# Patient Record
Sex: Male | Born: 1975
Health system: Southern US, Community
[De-identification: ages and names within clinical notes are randomized; demographics above are authoritative.]

---

## 2013-06-28 ENCOUNTER — Emergency Department (HOSPITAL_BASED_OUTPATIENT_CLINIC_OR_DEPARTMENT_OTHER)
Admission: EM | Admit: 2013-06-28 | Discharge: 2013-06-28 | Disposition: A | Discharge status: Home / Self Care | Payer: No Typology Code available for payment source | Attending: Emergency Medicine | Admitting: Emergency Medicine

## 2013-06-28 ENCOUNTER — Emergency Department (HOSPITAL_BASED_OUTPATIENT_CLINIC_OR_DEPARTMENT_OTHER): Payer: No Typology Code available for payment source

## 2013-06-28 ENCOUNTER — Encounter (HOSPITAL_BASED_OUTPATIENT_CLINIC_OR_DEPARTMENT_OTHER): Payer: Self-pay | Admitting: Emergency Medicine

## 2013-06-28 DIAGNOSIS — S139XXA Sprain of joints and ligaments of unspecified parts of neck, initial encounter: Secondary | ICD-10-CM | POA: Insufficient documentation

## 2013-06-28 DIAGNOSIS — Y9389 Activity, other specified: Secondary | ICD-10-CM | POA: Insufficient documentation

## 2013-06-28 DIAGNOSIS — Y92410 Unspecified street and highway as the place of occurrence of the external cause: Secondary | ICD-10-CM | POA: Insufficient documentation

## 2013-06-28 DIAGNOSIS — S161XXA Strain of muscle, fascia and tendon at neck level, initial encounter: Secondary | ICD-10-CM

## 2013-06-28 DIAGNOSIS — S0990XA Unspecified injury of head, initial encounter: Secondary | ICD-10-CM | POA: Insufficient documentation

## 2013-06-28 MED ORDER — CYCLOBENZAPRINE HCL 5 MG PO TABS: 5.0000 mg | ORAL_TABLET | Freq: Three times a day (TID) | ORAL | Status: DC | PRN

## 2013-06-28 MED ORDER — CYCLOBENZAPRINE HCL 10 MG PO TABS
5.0000 mg | ORAL_TABLET | Freq: Once | ORAL | Status: AC
  Administered 2013-06-28: 5 mg via ORAL
  Filled 2013-06-28: qty 1

## 2013-06-28 MED ORDER — IBUPROFEN 600 MG PO TABS: 600.0000 mg | ORAL_TABLET | Freq: Four times a day (QID) | ORAL | Status: DC | PRN

## 2013-06-28 MED ORDER — IBUPROFEN 400 MG PO TABS
600.0000 mg | ORAL_TABLET | Freq: Once | ORAL | Status: AC
  Administered 2013-06-28: 600 mg via ORAL
  Filled 2013-06-28 (×2): qty 1
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
## 2013-06-28 NOTE — ED Notes (Signed)
Patient transported to CT 

## 2013-06-28 NOTE — Discharge Instructions (Signed)
Cervical Sprain °A cervical sprain is an injury in the neck in which the strong, fibrous tissues (ligaments) that connect your neck bones stretch or tear. Cervical sprains can range from mild to severe. Severe cervical sprains can cause the neck vertebrae to be unstable. This can lead to damage of the spinal cord and can result in serious nervous system problems. The amount of time it takes for a cervical sprain to get better depends on the cause and extent of the injury. Most cervical sprains heal in 1 to 3 weeks. °CAUSES  °Severe cervical sprains may be caused by:  °· Contact sport injuries (such as from football, rugby, wrestling, hockey, auto racing, gymnastics, diving, martial arts, or boxing).   °· Motor vehicle collisions.   °· Whiplash injuries. This is an injury from a sudden forward-and backward whipping movement of the head and neck.  °· Falls.   °Mild cervical sprains may be caused by:  °· Being in an awkward position, such as while cradling a telephone between your ear and shoulder.   °· Sitting in a chair that does not offer proper support.   °· Working at a poorly designed computer station.   °· Looking up or down for long periods of time.   °SYMPTOMS  °· Pain, soreness, stiffness, or a burning sensation in the front, back, or sides of the neck. This discomfort may develop immediately after the injury or slowly, 24 hours or more after the injury.   °· Pain or tenderness directly in the middle of the back of the neck.   °· Shoulder or upper back pain.   °· Limited ability to move the neck.   °· Headache.   °· Dizziness.   °· Weakness, numbness, or tingling in the hands or arms.   °· Muscle spasms.   °· Difficulty swallowing or chewing.   °· Tenderness and swelling of the neck.   °DIAGNOSIS  °Most of the time your health care provider can diagnose a cervical sprain by taking your history and doing a physical exam. Your health care provider will ask about previous neck injuries and any known neck  problems, such as arthritis in the neck. X-rays may be taken to find out if there are any other problems, such as with the bones of the neck. Other tests, such as a CT scan or MRI, may also be needed.  °TREATMENT  °Treatment depends on the severity of the cervical sprain. Mild sprains can be treated with rest, keeping the neck in place (immobilization), and pain medicines. Severe cervical sprains are immediately immobilized. Further treatment is done to help with pain, muscle spasms, and other symptoms and may include: °· Medicines, such as pain relievers, numbing medicines, or muscle relaxants.   °· Physical therapy. This may involve stretching exercises, strengthening exercises, and posture training. Exercises and improved posture can help stabilize the neck, strengthen muscles, and help stop symptoms from returning.   °HOME CARE INSTRUCTIONS  °· Put ice on the injured area.   °· Put ice in a plastic bag.   °· Place a towel between your skin and the bag.   °· Leave the ice on for 15 20 minutes, 3 4 times a day.   °· If your injury was severe, you may have been given a cervical collar to wear. A cervical collar is a two-piece collar designed to keep your neck from moving while it heals. °· Do not remove the collar unless instructed by your health care provider. °· If you have long hair, keep it outside of the collar. °· Ask your health care provider before making any adjustments to your collar.   Minor adjustments may be required over time to improve comfort and reduce pressure on your chin or on the back of your head. °· If you are allowed to remove the collar for cleaning or bathing, follow your health care provider's instructions on how to do so safely. °· Keep your collar clean by wiping it with mild soap and water and drying it completely. If the collar you have been given includes removable pads, remove them every 1 2 days and hand wash them with soap and water. Allow them to air dry. They should be completely  dry before you wear them in the collar. °· If you are allowed to remove the collar for cleaning and bathing, wash and dry the skin of your neck. Check your skin for irritation or sores. If you see any, tell your health care provider. °· Do not drive while wearing the collar.   °· Only take over-the-counter or prescription medicines for pain, discomfort, or fever as directed by your health care provider.   °· Keep all follow-up appointments as directed by your health care provider.   °· Keep all physical therapy appointments as directed by your health care provider.   °· Make any needed adjustments to your workstation to promote good posture.   °· Avoid positions and activities that make your symptoms worse.   °· Warm up and stretch before being active to help prevent problems.   °SEEK MEDICAL CARE IF:  °· Your pain is not controlled with medicine.   °· You are unable to decrease your pain medicine over time as planned.   °· Your activity level is not improving as expected.   °SEEK IMMEDIATE MEDICAL CARE IF:  °· You develop any bleeding. °· You develop stomach upset. °· You have signs of an allergic reaction to your medicine.   °· Your symptoms get worse.   °· You develop new, unexplained symptoms.   °· You have numbness, tingling, weakness, or paralysis in any part of your body.   °MAKE SURE YOU:  °· Understand these instructions. °· Will watch your condition. °· Will get help right away if you are not doing well or get worse. °Document Released: 11/08/2006 Document Revised: 11/01/2012 Document Reviewed: 07/19/2012 °ExitCare® Patient Information ©2014 [employer]. ° °Motor Vehicle Collision  °It is common to have multiple bruises and sore muscles after a motor vehicle collision (MVC). These tend to feel worse for the first 24 hours. You may have the most stiffness and soreness over the first several hours. You may also feel worse when you wake up the first morning after your collision. After this point, you will  usually begin to improve with each day. The speed of improvement often depends on the severity of the collision, the number of injuries, and the location and nature of these injuries. °HOME CARE INSTRUCTIONS  °· Put ice on the injured area. °· Put ice in a plastic bag. °· Place a towel between your skin and the bag. °· Leave the ice on for 15-20 minutes, 03-04 times a day. °· Drink enough fluids to keep your urine clear or pale yellow. Do not drink alcohol. °· Take a warm shower or bath once or twice a day. This will increase blood flow to sore muscles. °· You may return to activities as directed by your caregiver. Be careful when lifting, as this may aggravate neck or back pain. °· Only take over-the-counter or prescription medicines for pain, discomfort, or fever as directed by your caregiver. Do not use aspirin. This may increase bruising and bleeding. °SEEK IMMEDIATE MEDICAL CARE IF: °·   You have numbness, tingling, or weakness in the arms or legs. °· You develop severe headaches not relieved with medicine. °· You have severe neck pain, especially tenderness in the middle of the back of your neck. °· You have changes in bowel or bladder control. °· There is increasing pain in any area of the body. °· You have shortness of breath, lightheadedness, dizziness, or fainting. °· You have chest pain. °· You feel sick to your stomach (nauseous), throw up (vomit), or sweat. °· You have increasing abdominal discomfort. °· There is blood in your urine, stool, or vomit. °· You have pain in your shoulder (shoulder strap areas). °· You feel your symptoms are getting worse. °MAKE SURE YOU:  °· Understand these instructions. °· Will watch your condition. °· Will get help right away if you are not doing well or get worse. °Document Released: 01/11/2005 Document Revised: 04/05/2011 Document Reviewed: 06/10/2010 °[employer]Care® Patient Information ©2014 [employer]. ° °

## 2013-07-05 ENCOUNTER — Encounter: Payer: Self-pay | Admitting: Sports Medicine

## 2013-07-05 ENCOUNTER — Ambulatory Visit (INDEPENDENT_AMBULATORY_CARE_PROVIDER_SITE_OTHER): Payer: 59 | Admitting: Sports Medicine

## 2013-07-05 VITALS — BP 155/83 | HR 96 | Ht 70.0 in | Wt 191.0 lb

## 2013-07-05 DIAGNOSIS — M51379 Other intervertebral disc degeneration, lumbosacral region without mention of lumbar back pain or lower extremity pain: Secondary | ICD-10-CM

## 2013-07-05 DIAGNOSIS — M5136 Other intervertebral disc degeneration, lumbar region: Secondary | ICD-10-CM

## 2013-07-05 DIAGNOSIS — M5137 Other intervertebral disc degeneration, lumbosacral region: Secondary | ICD-10-CM

## 2013-07-05 DIAGNOSIS — M51369 Other intervertebral disc degeneration, lumbar region without mention of lumbar back pain or lower extremity pain: Secondary | ICD-10-CM | POA: Insufficient documentation

## 2013-07-05 MED ORDER — PREDNISONE 50 MG PO TABS: ORAL_TABLET | ORAL | Status: DC

## 2013-07-05 MED ORDER — KETOROLAC TROMETHAMINE 30 MG/ML IJ SOLN
30.0000 mg | Freq: Once | INTRAMUSCULAR | Status: AC
  Administered 2013-07-05: 30 mg via INTRAMUSCULAR

## 2013-07-05 MED ORDER — MELOXICAM 15 MG PO TABS: ORAL_TABLET | ORAL | Status: DC

## 2013-07-05 MED ORDER — METHYLPREDNISOLONE ACETATE 80 MG/ML IJ SUSP
80.0000 mg | Freq: Once | INTRAMUSCULAR | Status: AC
  Administered 2013-07-05: 80 mg via INTRAMUSCULAR

## 2013-07-05 NOTE — Assessment & Plan Note (Signed)
Is a large L5-S1 disc protrusion likely affecting the right side of L5 and S1 nerve root. 80 mg Depo-Medrol, 30mg  of Toradol intramuscular. Prednisone, Mobic, continue Flexeril given by the emergency department. Home rehabilitation. Return to see me in a month. He is working her short-term disability, I told him I am happy to sign off for a month
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
## 2013-08-02 ENCOUNTER — Encounter: Payer: Self-pay | Admitting: Sports Medicine

## 2013-08-02 ENCOUNTER — Telehealth: Payer: Self-pay

## 2013-08-02 ENCOUNTER — Ambulatory Visit: Payer: 59 | Admitting: Sports Medicine

## 2013-08-02 NOTE — Telephone Encounter (Signed)
Adam MuirJamie called and stated that he had to cancel his appointment for today but has an appointment with you Monday but his work note runs out today. He wants to know if it is possible if you can write a work note for him until [employer]/Aaleigha Bozza,CMA

## 2013-08-02 NOTE — Telephone Encounter (Signed)
LMOM that letter is ready for pick up./Demian Maisel,CMA

## 2013-08-02 NOTE — Telephone Encounter (Signed)
Letter is in my box 

## 2013-08-06 ENCOUNTER — Encounter: Payer: Self-pay | Admitting: Sports Medicine

## 2013-08-06 ENCOUNTER — Ambulatory Visit (INDEPENDENT_AMBULATORY_CARE_PROVIDER_SITE_OTHER): Payer: 59 | Admitting: Sports Medicine

## 2013-08-06 VITALS — BP 149/92 | HR 128 | Ht 70.0 in | Wt 190.0 lb

## 2013-08-06 DIAGNOSIS — M51369 Other intervertebral disc degeneration, lumbar region without mention of lumbar back pain or lower extremity pain: Secondary | ICD-10-CM

## 2013-08-06 DIAGNOSIS — M5137 Other intervertebral disc degeneration, lumbosacral region: Secondary | ICD-10-CM

## 2013-08-06 DIAGNOSIS — M5136 Other intervertebral disc degeneration, lumbar region: Secondary | ICD-10-CM

## 2013-08-06 DIAGNOSIS — M51379 Other intervertebral disc degeneration, lumbosacral region without mention of lumbar back pain or lower extremity pain: Secondary | ICD-10-CM

## 2013-08-06 MED ORDER — KETOROLAC TROMETHAMINE 30 MG/ML IJ SOLN
30.0000 mg | Freq: Once | INTRAMUSCULAR | Status: AC
  Administered 2013-08-06: 30 mg via INTRAMUSCULAR

## 2013-08-06 NOTE — Progress Notes (Signed)
  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 Unfortunately only improved temporarily while on the steroids the pain has returned. Continues to be predominantly discogenic, moderate, persistent.  Past medical history, Surgical history, Family history not pertinant except as noted below, Social history, Allergies, and medications have been entered into the medical record, reviewed, and no changes needed.   Review of Systems: No fevers, chills, night sweats, weight loss, chest pain, or shortness of breath.   Objective:    General: Well Developed, well nourished, and in no acute distress.  Neuro: Alert and oriented x3, extra-ocular muscles intact, sensation grossly intact.  HEENT: Normocephalic, atraumatic, pupils equal round reactive to light, neck supple, no masses, no lymphadenopathy, thyroid nonpalpable.  Skin: Warm and dry, no rashes. Cardiac: Regular rate and rhythm, no murmurs rubs or gallops, no lower extremity edema.  Respiratory: Clear to auscultation bilaterally. Not using accessory muscles, speaking in full sentences. Low-back: Positive straight-leg raise on the right side.  Impression and Recommendations:

## 2013-08-06 NOTE — Assessment & Plan Note (Signed)
Right-sided radiculopathy. Felt good while on steroids, unfortunately pain has returned. Repeat 30 mg of Toradol intramuscular. At this point we are going to proceed to a right-sided L5-S1 interlaminar epidural injection. Return in 2 weeks, out of work for an additional 2 weeks. I do think there is a relatively high likelihood he will proceed to microdiscectomy.

## 2013-08-09 ENCOUNTER — Ambulatory Visit
Admission: RE | Admit: 2013-08-09 | Discharge: 2013-08-09 | Disposition: A | Discharge status: Home / Self Care | Payer: 59 | Source: Ambulatory Visit | Attending: Sports Medicine | Admitting: Sports Medicine

## 2013-08-09 MED ORDER — IOHEXOL 180 MG/ML  SOLN
1.0000 mL | Freq: Once | INTRAMUSCULAR | Status: AC | PRN
  Administered 2013-08-09: 1 mL via EPIDURAL

## 2013-08-09 MED ORDER — METHYLPREDNISOLONE ACETATE 40 MG/ML INJ SUSP (RADIOLOG
120.0000 mg | Freq: Once | INTRAMUSCULAR | Status: AC
  Administered 2013-08-09: 120 mg via EPIDURAL

## 2013-08-09 NOTE — Discharge Instructions (Signed)

## 2013-08-13 ENCOUNTER — Telehealth: Payer: Self-pay

## 2013-08-13 NOTE — Telephone Encounter (Signed)
Letter is in my box 

## 2013-08-13 NOTE — Telephone Encounter (Signed)
Patient called stated that he just got epidural done on last week and he is scheduled to come follow up on 08/23/2013. He stated that previous note has him out of work til 08/06/13. He would like a letter extending  His days to after 08/24/2013. Baylee Mccorkel,CMA

## 2013-08-14 NOTE — Telephone Encounter (Signed)
Called patient left message on vm that letter was ready for pickup. Rhonda Cunningham,CMA

## 2013-08-16 ENCOUNTER — Ambulatory Visit (INDEPENDENT_AMBULATORY_CARE_PROVIDER_SITE_OTHER): Payer: 59 | Admitting: Sports Medicine

## 2013-08-16 ENCOUNTER — Encounter: Payer: Self-pay | Admitting: Sports Medicine

## 2013-08-16 VITALS — BP 169/96 | HR 108 | Ht 70.0 in | Wt 194.0 lb

## 2013-08-16 DIAGNOSIS — M5137 Other intervertebral disc degeneration, lumbosacral region: Secondary | ICD-10-CM

## 2013-08-16 DIAGNOSIS — M5136 Other intervertebral disc degeneration, lumbar region: Secondary | ICD-10-CM

## 2013-08-16 DIAGNOSIS — M51369 Other intervertebral disc degeneration, lumbar region without mention of lumbar back pain or lower extremity pain: Secondary | ICD-10-CM

## 2013-08-16 DIAGNOSIS — M51379 Other intervertebral disc degeneration, lumbosacral region without mention of lumbar back pain or lower extremity pain: Secondary | ICD-10-CM

## 2013-08-16 MED ORDER — TRAMADOL HCL 50 MG PO TABS: ORAL_TABLET | ORAL | Status: AC

## 2013-08-16 NOTE — Assessment & Plan Note (Signed)
Only a mild response to a right-sided L5-S1 transforaminal epidural. He does have a large -sized L5-S1 disc protrusion with lateral recess and foraminal components. At this point he does become a surgical candidate for likely microdiscectomy. Referral to neurosurgery Tues July 28, @ 3:30. He has also developed some gastritis due to the steroids, I given him some samples of PPI. GI cocktail also given in the office.

## 2013-08-16 NOTE — Patient Instructions (Signed)
[facility] Tues July 28, @ 3:30.

## 2013-08-16 NOTE — Progress Notes (Signed)
  Subjective:    CC: Followup  HPI: This is a very pleasant 38 year old male, he has lumbar degenerative disc disease with right-sided radiculopathy. He has now been through physical therapy, steroids, NSAIDs. Nothing work, we did obtain an MRI that showed a fairly large disc extrusion with lateral recess and foraminal extension at the L5-S1 level. We then obtained a right-sided L5-S1 transforaminal epidural which improved his pain but he continued to have numbness. He's also developed some epigastric pain after the steroid injection. He denies any melena, hematochezia, hematemesis.  Symptoms are moderate, persistent.  Past medical history, Surgical history, Family history not pertinant except as noted below, Social history, Allergies, and medications have been entered into the medical record, reviewed, and no changes needed.   Review of Systems: No fevers, chills, night sweats, weight loss, chest pain, or shortness of breath.   Objective:    General: Well Developed, well nourished, and in no acute distress.  Neuro: Alert and oriented x3, extra-ocular muscles intact, sensation grossly intact.  HEENT: Normocephalic, atraumatic, pupils equal round reactive to light, neck supple, no masses, no lymphadenopathy, thyroid nonpalpable.  Skin: Warm and dry, no rashes. Cardiac: Regular rate and rhythm, no murmurs rubs or gallops, no lower extremity edema.  Respiratory: Clear to auscultation bilaterally. Not using accessory muscles, speaking in full sentences. Abdomen: Soft, minimally tender to palpation in the epigastrium, nondistended, normal bowel sounds, no palpable masses, no guarding, no rigidity.  Oral GI cocktail given in the office.  Impression and Recommendations:

## 2013-08-21 ENCOUNTER — Ambulatory Visit: Payer: 59 | Admitting: Sports Medicine

## 2013-08-23 ENCOUNTER — Ambulatory Visit: Payer: 59 | Admitting: Sports Medicine

## 2014-01-02 ENCOUNTER — Ambulatory Visit (HOSPITAL_COMMUNITY)
Admission: RE | Admit: 2014-01-02 | Discharge: 2014-01-02 | Disposition: A | Discharge status: Home / Self Care | Payer: 59 | Source: Ambulatory Visit | Attending: Neurological Surgery | Admitting: Neurological Surgery

## 2014-01-02 ENCOUNTER — Other Ambulatory Visit (HOSPITAL_COMMUNITY): Payer: Self-pay | Admitting: Neurological Surgery

## 2014-01-02 DIAGNOSIS — M79609 Pain in unspecified limb: Secondary | ICD-10-CM

## 2014-01-02 DIAGNOSIS — M25561 Pain in right knee: Secondary | ICD-10-CM | POA: Insufficient documentation

## 2014-01-02 DIAGNOSIS — R200 Anesthesia of skin: Secondary | ICD-10-CM

## 2014-01-02 DIAGNOSIS — R208 Other disturbances of skin sensation: Secondary | ICD-10-CM | POA: Diagnosis not present

## 2014-01-02 NOTE — Progress Notes (Signed)
VASCULAR LAB PRELIMINARY  PRELIMINARY  PRELIMINARY  PRELIMINARY  Right lower extremity venous duplex completed.    Preliminary report:  Right:  No evidence of DVT, superficial thrombosis, or Baker's cyst.  Abagayle Klutts, RVT 01/02/2014, 4:08 PM
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
## 2020-02-05 ENCOUNTER — Ambulatory Visit: Payer: Self-pay | Attending: Internal Medicine

## 2020-02-05 ENCOUNTER — Other Ambulatory Visit (HOSPITAL_BASED_OUTPATIENT_CLINIC_OR_DEPARTMENT_OTHER): Payer: Self-pay | Admitting: Internal Medicine

## 2020-02-05 DIAGNOSIS — Z23 Encounter for immunization: Secondary | ICD-10-CM

## 2020-02-05 MED FILL — PFIZER-BIONTECH COVID-19 VA: 30 | 21 days supply | Qty: 0 | Fill #0

## 2020-02-05 NOTE — Progress Notes (Signed)
   [facility]  Name:  Adam Mclaughlin    MRN: [id number] DOB: [birth year]  02/05/2020  Adam Mclaughlin was observed post Covid-19 immunization for 15 minutes without incident. He was provided with Vaccine Information Sheet and instruction to access the V-Safe system.   Adam Mclaughlin was instructed to call 911 with any severe reactions post vaccine: Marland Kitchen Difficulty breathing  . Swelling of face and throat  . A fast heartbeat  . A bad rash all over body  . Dizziness and weakness   Immunizations Administered    Name Date Dose VIS Date Route   Pfizer COVID-19 Vaccine 02/05/2020  9:14 AM 0.3 mL 11/14/2019 Intramuscular   Manufacturer: [employer], [employer]   Lot: [id number]   NDC: [address]-1
